# Patient Record
Sex: Female | Born: 1965 | Race: Black or African American | Hispanic: No | Marital: Single | Smoking: Never smoker
Health system: Southern US, Community
[De-identification: ages and names within clinical notes are randomized; demographics above are authoritative.]

## PROBLEM LIST (undated history)

## (undated) ENCOUNTER — Emergency Department (HOSPITAL_BASED_OUTPATIENT_CLINIC_OR_DEPARTMENT_OTHER): Admission: EM | Payer: Self-pay | Source: Home / Self Care

## (undated) DIAGNOSIS — G2581 Restless legs syndrome: Secondary | ICD-10-CM

## (undated) DIAGNOSIS — G4730 Sleep apnea, unspecified: Secondary | ICD-10-CM

## (undated) DIAGNOSIS — I10 Essential (primary) hypertension: Secondary | ICD-10-CM

## (undated) DIAGNOSIS — D573 Sickle-cell trait: Secondary | ICD-10-CM

## (undated) HISTORY — PX: LITHOTRIPSY: SUR834

## (undated) HISTORY — PX: APPENDECTOMY: SHX54

## (undated) HISTORY — PX: DILATION AND CURETTAGE OF UTERUS: SHX78

## (undated) HISTORY — PX: TONSILLECTOMY: SUR1361

## (undated) HISTORY — PX: ABDOMINAL HYSTERECTOMY: SHX81

---

## 2010-08-08 ENCOUNTER — Other Ambulatory Visit (HOSPITAL_BASED_OUTPATIENT_CLINIC_OR_DEPARTMENT_OTHER): Payer: Self-pay | Admitting: Family Medicine

## 2010-08-08 ENCOUNTER — Ambulatory Visit (HOSPITAL_BASED_OUTPATIENT_CLINIC_OR_DEPARTMENT_OTHER)
Admission: RE | Admit: 2010-08-08 | Discharge: 2010-08-08 | Disposition: A | Discharge status: Home / Self Care | Payer: BC Managed Care – PPO | Source: Ambulatory Visit | Attending: Family Medicine | Admitting: Family Medicine

## 2010-08-08 ENCOUNTER — Ambulatory Visit (INDEPENDENT_AMBULATORY_CARE_PROVIDER_SITE_OTHER)
Admission: RE | Admit: 2010-08-08 | Discharge: 2010-08-08 | Disposition: A | Discharge status: Home / Self Care | Payer: BC Managed Care – PPO | Source: Ambulatory Visit | Attending: Family Medicine | Admitting: Family Medicine

## 2010-08-08 DIAGNOSIS — R109 Unspecified abdominal pain: Secondary | ICD-10-CM | POA: Insufficient documentation

## 2010-08-08 DIAGNOSIS — R52 Pain, unspecified: Secondary | ICD-10-CM

## 2010-08-08 DIAGNOSIS — J984 Other disorders of lung: Secondary | ICD-10-CM

## 2010-08-08 DIAGNOSIS — N200 Calculus of kidney: Secondary | ICD-10-CM | POA: Insufficient documentation

## 2010-08-09 ENCOUNTER — Other Ambulatory Visit (HOSPITAL_BASED_OUTPATIENT_CLINIC_OR_DEPARTMENT_OTHER): Payer: Self-pay | Admitting: Emergency Medicine

## 2010-08-09 DIAGNOSIS — R52 Pain, unspecified: Secondary | ICD-10-CM

## 2010-09-02 ENCOUNTER — Other Ambulatory Visit (HOSPITAL_BASED_OUTPATIENT_CLINIC_OR_DEPARTMENT_OTHER): Payer: Self-pay | Admitting: Family Medicine

## 2010-09-02 DIAGNOSIS — R222 Localized swelling, mass and lump, trunk: Secondary | ICD-10-CM

## 2010-09-03 ENCOUNTER — Ambulatory Visit (HOSPITAL_BASED_OUTPATIENT_CLINIC_OR_DEPARTMENT_OTHER)
Admission: RE | Admit: 2010-09-03 | Discharge: 2010-09-03 | Disposition: A | Discharge status: Home / Self Care | Payer: BC Managed Care – PPO | Source: Ambulatory Visit | Attending: Family Medicine | Admitting: Family Medicine

## 2010-09-03 DIAGNOSIS — R222 Localized swelling, mass and lump, trunk: Secondary | ICD-10-CM

## 2010-09-03 DIAGNOSIS — J984 Other disorders of lung: Secondary | ICD-10-CM | POA: Insufficient documentation

## 2010-09-03 MED ORDER — IOHEXOL 300 MG/ML  SOLN
100.0000 mL | Freq: Once | INTRAMUSCULAR | Status: AC | PRN
  Administered 2010-09-03: 80 mL via INTRAVENOUS

## 2010-09-26 ENCOUNTER — Emergency Department (HOSPITAL_BASED_OUTPATIENT_CLINIC_OR_DEPARTMENT_OTHER)
Admission: EM | Admit: 2010-09-26 | Discharge: 2010-09-26 | Disposition: A | Discharge status: Home / Self Care | Payer: BC Managed Care – PPO | Attending: Emergency Medicine | Admitting: Emergency Medicine

## 2010-09-26 ENCOUNTER — Encounter: Payer: Self-pay | Admitting: Emergency Medicine

## 2010-09-26 DIAGNOSIS — I10 Essential (primary) hypertension: Secondary | ICD-10-CM | POA: Insufficient documentation

## 2010-09-26 DIAGNOSIS — H109 Unspecified conjunctivitis: Secondary | ICD-10-CM | POA: Insufficient documentation

## 2010-09-26 DIAGNOSIS — G4730 Sleep apnea, unspecified: Secondary | ICD-10-CM | POA: Insufficient documentation

## 2010-09-26 DIAGNOSIS — H5710 Ocular pain, unspecified eye: Secondary | ICD-10-CM | POA: Insufficient documentation

## 2010-09-26 HISTORY — DX: Essential (primary) hypertension: I10

## 2010-09-26 HISTORY — DX: Sickle-cell trait: D57.3

## 2010-09-26 HISTORY — DX: Sleep apnea, unspecified: G47.30

## 2010-09-26 HISTORY — DX: Restless legs syndrome: G25.81

## 2010-09-26 NOTE — ED Notes (Signed)
PCP Roselyn Meier [facility]

## 2010-09-26 NOTE — ED Notes (Signed)
Pt c/o bilateral eye pain; started with drainage on Thurs; was seen yesterday at [facility] for same; started on cipro gtts

## 2010-09-26 NOTE — ED Provider Notes (Signed)
History     Chief Complaint  Patient presents with  . Eye Pain   HPI Comments: Patient notes that she started having watery eye discharge on Thursday. Since then she's had gradual reddening of both of her eyes. She's had increasing yellowish white discharge. She was seen apparently yesterday in started on Cipro eyedrops every 2 hours while she's awake. Today she feels that the symptoms are not improving so she has come in for for further evaluation.  Patient is a 45 y.o. female presenting with eye pain. The history is provided by the patient.  Eye Pain This is a new problem. The current episode started more than 2 days ago. The problem occurs constantly. The problem has been gradually worsening. Pertinent negatives include no chest pain, no abdominal pain, no headaches and no shortness of breath. Exacerbated by: light. The symptoms are relieved by nothing. She has tried a cold compress (cipro gtts given to her by [facility] yesterday) for the symptoms. The treatment provided no relief.    Past Medical History  Diagnosis Date  . Hypertension   . Sickle cell trait   . Sleep apnea   . RLS (restless legs syndrome)     Past Surgical History  Procedure Date  . Appendectomy   . Lithotripsy   . Abdominal hysterectomy   . Tonsillectomy   . Dilation and curettage of uterus     No family history on file.  History  Substance Use Topics  . Smoking status: Never Smoker   . Smokeless tobacco: Not on file  . Alcohol Use: No    OB History    Grav Para Term Preterm Abortions TAB SAB Ect Mult Living                  Review of Systems  Constitutional: Negative.  Negative for fever and chills.  HENT: Negative.   Eyes: Positive for photophobia, pain, discharge and redness.  Respiratory: Negative.  Negative for cough and shortness of breath.   Cardiovascular: Negative.  Negative for chest pain.  Gastrointestinal: Negative.  Negative for nausea, vomiting, abdominal pain and diarrhea.    Genitourinary: Negative.  Negative for dysuria and vaginal discharge.  Musculoskeletal: Negative.  Negative for back pain.  Skin: Negative.  Negative for color change and rash.  Neurological: Negative.  Negative for syncope and headaches.  Hematological: Negative.  Negative for adenopathy.  Psychiatric/Behavioral: Negative.  Negative for confusion.  All other systems reviewed and are negative.    Physical Exam  BP 120/71  Pulse 96  Temp(Src) 98.5 F (36.9 C) (Oral)  Resp 18  Ht 4\' 11"  (1.499 m)  Wt 209 lb (94.802 kg)  BMI 42.21 kg/m2  SpO2 97%  Physical Exam  Constitutional: She is oriented to person, place, and time. She appears well-developed and well-nourished.  HENT:  Head: Normocephalic and atraumatic.  Eyes: EOM are normal. Pupils are equal, round, and reactive to light. Right eye exhibits discharge. Left eye exhibits no discharge. No scleral icterus.       Bilateral conjunctiva are red. No discharge currently. Pupils are equal round reactive to light. No foreign bodies noted.  Pulmonary/Chest: Effort normal.  Musculoskeletal: Normal range of motion.  Neurological: She is alert and oriented to person, place, and time.  Skin: Skin is warm and dry.  Psychiatric: She has a normal mood and affect. Her behavior is normal. Judgment and thought content normal.    ED Course  Procedures  MDM Patient is already on Cipro eyedrops.  Advised her to continue on current drops. She is to followup with her own optometrist or ophthalmologist later this week if she's not improving. She understands these instructions.      Nat Christen, MD 09/26/10 [phone]

## 2011-08-11 DIAGNOSIS — I10 Essential (primary) hypertension: Secondary | ICD-10-CM | POA: Insufficient documentation

## 2011-10-13 DIAGNOSIS — G43409 Hemiplegic migraine, not intractable, without status migrainosus: Secondary | ICD-10-CM | POA: Insufficient documentation

## 2012-04-16 DIAGNOSIS — G4700 Insomnia, unspecified: Secondary | ICD-10-CM | POA: Insufficient documentation

## 2015-08-26 ENCOUNTER — Ambulatory Visit (INDEPENDENT_AMBULATORY_CARE_PROVIDER_SITE_OTHER): Payer: BC Managed Care – PPO | Admitting: Physician Assistant

## 2015-08-26 VITALS — BP 126/80 | HR 85 | Temp 98.3°F | Resp 18 | Ht 59.0 in | Wt 209.0 lb

## 2015-08-26 DIAGNOSIS — M62838 Other muscle spasm: Secondary | ICD-10-CM

## 2015-08-26 DIAGNOSIS — D573 Sickle-cell trait: Secondary | ICD-10-CM | POA: Insufficient documentation

## 2015-08-26 MED ORDER — CYCLOBENZAPRINE HCL 5 MG PO TABS: 5.0000 mg | ORAL_TABLET | Freq: Three times a day (TID) | ORAL | Status: DC | PRN

## 2015-08-26 MED ORDER — HYDROCODONE-ACETAMINOPHEN 5-325 MG PO TABS: 1.0000 | ORAL_TABLET | Freq: Every evening | ORAL | Status: DC | PRN

## 2015-08-26 MED ORDER — MELOXICAM 15 MG PO TABS: 15.0000 mg | ORAL_TABLET | Freq: Every day | ORAL | Status: DC

## 2015-08-26 NOTE — Progress Notes (Signed)
Molly Padilla  MRN: [id number] DOB: 08/06/1965  Subjective:  Pt presents to clinic with left shoulder/neck pain that started on 6/5 when she was in the airport traveling and carried her luggage on her right shoulder through a long airport terminal.  Since then she has been having pain.  She thought that it would get better with time but it has not.  She is now having pain go down her let arm and up into her left neck.  She has no tingling or paresthesia.  She has been using icy hot and advil but not getting much relief  She had some left over hydrocodone that has helped the pain so she can sleep.  She is unable to sleep well because she cannot get the area comfortable.  She is right handed and she has had carpal tunnel release on the right and so she does not do a lot of lifting on that side.  Patient Active Problem List   Diagnosis Date Noted  . Sickle cell trait (HCC) 08/26/2015  . Cannot sleep 04/16/2012  . Headache, hemiplegic migraine 10/13/2011  . Essential (primary) hypertension 08/11/2011  . Morbid obesity (HCC) 08/11/2011  . Obstructive apnea 06/05/2009    Current Outpatient Prescriptions on File Prior to Visit  Medication Sig Dispense Refill  . triamterene-hydrochlorothiazide (DYAZIDE) 37.5-25 MG per capsule Take 1 capsule by mouth every morning.       No current facility-administered medications on file prior to visit.    Allergies  Allergen Reactions  . Zithromax [Azithromycin Dihydrate] Other (See Comments)    unknown reaction    Review of Systems  Constitutional: Negative for fever and chills.  Musculoskeletal: Positive for neck pain.   Objective:  BP 126/80 mmHg  Pulse 85  Temp(Src) 98.3 F (36.8 C) (Oral)  Resp 18  Ht  (1.499 m)  Wt 209 lb (94.802 kg)  BMI 42.19 kg/m2  SpO2 98%  Physical Exam  Constitutional: She is oriented to person, place, and time and well-developed, well-nourished, and in no distress.  HENT:  Head: Normocephalic and  atraumatic.  Right Ear: Hearing and external ear normal.  Left Ear: Hearing and external ear normal.  Eyes: Conjunctivae are normal.  Neck: Normal range of motion.  Pulmonary/Chest: Effort normal.  Musculoskeletal:       Right shoulder: Normal.       Left shoulder: She exhibits tenderness (muslcular TTP over trapezius and deltoid). She exhibits normal range of motion and no bony tenderness.       Cervical back: She exhibits tenderness.       Back:       Right upper arm: Normal.       Left upper arm: Normal.  Neurological: She is alert and oriented to person, place, and time. She has normal sensation, normal strength and normal reflexes. Gait normal.  Skin: Skin is warm and dry.  Psychiatric: Mood, memory, affect and judgment normal.  Vitals reviewed.   Assessment and Plan :  Muscle spasm - Plan: cyclobenzaprine (FLEXERIL) 5 MG tablet, meloxicam (MOBIC) 15 MG tablet, HYDROcodone-acetaminophen (NORCO/VICODIN) 5-325 MG tablet   Pt has an overuse strain that we will treat with heat and NSAIDs with muscle relaxers.  She will use the norco for at bedtime to help her sleep.  She will do some home PT and if it is not improving in the next 10 day we will consider formal PT due to the time frame that she has her symptoms.  Benny Lennert PA-C  [facility] 08/26/2015 5:51 PM

## 2015-08-26 NOTE — Patient Instructions (Addendum)
Heat to the area -   EXERCISES RANGE OF MOTION (ROM) AND STRETCHING EXERCISES - Cervical Strain and Sprain These exercises may help you when beginning to rehabilitate your injury. In order to successfully resolve your symptoms, you must improve your posture. These exercises are designed to help reduce the forward-head and rounded-shoulder posture which contributes to this condition. Your symptoms may resolve with or without further involvement from your physician, physical therapist or athletic trainer. While completing these exercises, remember:   Restoring tissue flexibility helps normal motion to return to the joints. This allows healthier, less painful movement and activity.  An effective stretch should be held for at least 20 seconds, although you may need to begin with shorter hold times for comfort.  A stretch should never be painful. You should only feel a gentle lengthening or release in the stretched tissue. STRETCH- Axial Extensors  Lie on your back on the floor. You may bend your knees for comfort. Place a rolled-up hand towel or dish towel, about 2 inches in diameter, under the part of your head that makes contact with the floor.  Gently tuck your chin, as if trying to make a "double chin," until you feel a gentle stretch at the base of your head.  Hold __________ seconds. Repeat __________ times. Complete this exercise __________ times per day.  STRETCH - Axial Extension   Stand or sit on a firm surface. Assume a good posture: chest up, shoulders drawn back, abdominal muscles slightly tense, knees unlocked (if standing) and feet hip width apart.  Slowly retract your chin so your head slides back and your chin slightly lowers. Continue to look straight ahead.  You should feel a gentle stretch in the back of your head. Be certain not to feel an aggressive stretch since this can cause headaches later.  Hold for __________ seconds. Repeat __________ times. Complete this exercise  __________ times per day. STRETCH - Cervical Side Bend   Stand or sit on a firm surface. Assume a good posture: chest up, shoulders drawn back, abdominal muscles slightly tense, knees unlocked (if standing) and feet hip width apart.  Without letting your nose or shoulders move, slowly tip your right / left ear to your shoulder until your feel a gentle stretch in the muscles on the opposite side of your neck.  Hold __________ seconds. Repeat __________ times. Complete this exercise __________ times per day. STRETCH - Cervical Rotators   Stand or sit on a firm surface. Assume a good posture: chest up, shoulders drawn back, abdominal muscles slightly tense, knees unlocked (if standing) and feet hip width apart.  Keeping your eyes level with the ground, slowly turn your head until you feel a gentle stretch along the back and opposite side of your neck.  Hold __________ seconds. Repeat __________ times. Complete this exercise __________ times per day. RANGE OF MOTION - Neck Circles   Stand or sit on a firm surface. Assume a good posture: chest up, shoulders drawn back, abdominal muscles slightly tense, knees unlocked (if standing) and feet hip width apart.  Gently roll your head down and around from the back of one shoulder to the back of the other. The motion should never be forced or painful.  Repeat the motion 10-20 times, or until you feel the neck muscles relax and loosen. Repeat __________ times. Complete the exercise __________ times per day. STRENGTHENING EXERCISES - Cervical Strain and Sprain These exercises may help you when beginning to rehabilitate your injury. They may resolve  your symptoms with or without further involvement from your physician, physical therapist, or athletic trainer. While completing these exercises, remember:   Muscles can gain both the endurance and the strength needed for everyday activities through controlled exercises.  Complete these exercises as  instructed by your physician, physical therapist, or athletic trainer. Progress the resistance and repetitions only as guided.  You may experience muscle soreness or fatigue, but the pain or discomfort you are trying to eliminate should never worsen during these exercises. If this pain does worsen, stop and make certain you are following the directions exactly. If the pain is still present after adjustments, discontinue the exercise until you can discuss the trouble with your clinician. STRENGTH - Cervical Flexors, Isometric  Face a wall, standing about 6 inches away. Place a small pillow, a ball about 6-8 inches in diameter, or a folded towel between your forehead and the wall.  Slightly tuck your chin and gently push your forehead into the soft object. Push only with mild to moderate intensity, building up tension gradually. Keep your jaw and forehead relaxed.  Hold 10 to 20 seconds. Keep your breathing relaxed.  Release the tension slowly. Relax your neck muscles completely before you start the next repetition. Repeat __________ times. Complete this exercise __________ times per day. STRENGTH- Cervical Lateral Flexors, Isometric   Stand about 6 inches away from a wall. Place a small pillow, a ball about 6-8 inches in diameter, or a folded towel between the side of your head and the wall.  Slightly tuck your chin and gently tilt your head into the soft object. Push only with mild to moderate intensity, building up tension gradually. Keep your jaw and forehead relaxed.  Hold 10 to 20 seconds. Keep your breathing relaxed.  Release the tension slowly. Relax your neck muscles completely before you start the next repetition. Repeat __________ times. Complete this exercise __________ times per day. STRENGTH - Cervical Extensors, Isometric   Stand about 6 inches away from a wall. Place a small pillow, a ball about 6-8 inches in diameter, or a folded towel between the back of your head and the  wall.  Slightly tuck your chin and gently tilt your head back into the soft object. Push only with mild to moderate intensity, building up tension gradually. Keep your jaw and forehead relaxed.  Hold 10 to 20 seconds. Keep your breathing relaxed.  Release the tension slowly. Relax your neck muscles completely before you start the next repetition. Repeat __________ times. Complete this exercise __________ times per day. POSTURE AND BODY MECHANICS CONSIDERATIONS - Cervical Strain and Sprain Keeping correct posture when sitting, standing or completing your activities will reduce the stress put on different body tissues, allowing injured tissues a chance to heal and limiting painful experiences. The following are general guidelines for improved posture. Your physician or physical therapist will provide you with any instructions specific to your needs. While reading these guidelines, remember:  The exercises prescribed by your provider will help you have the flexibility and strength to maintain correct postures.  The correct posture provides the optimal environment for your joints to work. All of your joints have less wear and tear when properly supported by a spine with good posture. This means you will experience a healthier, less painful body.  Correct posture must be practiced with all of your activities, especially prolonged sitting and standing. Correct posture is as important when doing repetitive low-stress activities (typing) as it is when doing a single heavy-load activity (  lifting). PROLONGED STANDING WHILE SLIGHTLY LEANING FORWARD When completing a task that requires you to lean forward while standing in one place for a long time, place either foot up on a stationary 2- to 4-inch high object to help maintain the best posture. When both feet are on the ground, the low back tends to lose its slight inward curve. If this curve flattens (or becomes too large), then the back and your other joints  will experience too much stress, fatigue more quickly, and can cause pain.  RESTING POSITIONS Consider which positions are most painful for you when choosing a resting position. If you have pain with flexion-based activities (sitting, bending, stooping, squatting), choose a position that allows you to rest in a less flexed posture. You would want to avoid curling into a fetal position on your side. If your pain worsens with extension-based activities (prolonged standing, working overhead), avoid resting in an extended position such as sleeping on your stomach. Most people will find more comfort when they rest with their spine in a more neutral position, neither too rounded nor too arched. Lying on a non-sagging bed on your side with a pillow between your knees, or on your back with a pillow under your knees will often provide some relief. Keep in mind, being in any one position for a prolonged period of time, no matter how correct your posture, can still lead to stiffness. WALKING Walk with an upright posture. Your ears, shoulders, and hips should all line up. OFFICE WORK When working at a desk, create an environment that supports good, upright posture. Without extra support, muscles fatigue and lead to excessive strain on joints and other tissues. CHAIR:  A chair should be able to slide under your desk when your back makes contact with the back of the chair. This allows you to work closely.  The chair's height should allow your eyes to be level with the upper part of your monitor and your hands to be slightly lower than your elbows.  Body position:  Your feet should make contact with the floor. If this is not possible, use a foot rest.  Keep your ears over your shoulders. This will reduce stress on your neck and low back.   This information is not intended to replace advice given to you by your health care provider. Make sure you discuss any questions you have with your health care provider.    Document Released: 02/21/2005 Document Revised: 03/14/2014 Document Reviewed: 06/05/2008 Elsevier Interactive Patient Education 2016 [employer].     IF you received an x-ray today, you will receive an invoice from [facility]. Please contact [facility] at [phone] with questions or concerns regarding your invoice.   IF you received labwork today, you will receive an invoice from [employer]. Please contact [facility] at [phone] with questions or concerns regarding your invoice.   Our billing staff will not be able to assist you with questions regarding bills from these companies.  You will be contacted with the lab results as soon as they are available. The fastest way to get your results is to activate your My Chart account. Instructions are located on the last page of this paperwork. If you have not heard from [place] regarding the results in 2 weeks, please contact this office.

## 2015-08-31 ENCOUNTER — Ambulatory Visit (INDEPENDENT_AMBULATORY_CARE_PROVIDER_SITE_OTHER): Payer: Worker's Compensation | Admitting: Physician Assistant

## 2015-08-31 ENCOUNTER — Ambulatory Visit: Payer: Worker's Compensation

## 2015-08-31 VITALS — BP 118/84 | HR 82 | Temp 98.0°F | Resp 18 | Ht 59.0 in | Wt 209.8 lb

## 2015-08-31 DIAGNOSIS — M542 Cervicalgia: Secondary | ICD-10-CM

## 2015-08-31 DIAGNOSIS — M62838 Other muscle spasm: Secondary | ICD-10-CM | POA: Diagnosis not present

## 2015-08-31 MED ORDER — PREDNISONE 20 MG PO TABS: 40.0000 mg | ORAL_TABLET | Freq: Every day | ORAL | Status: DC

## 2015-08-31 NOTE — Patient Instructions (Addendum)
Begin prednisone and take as prescribed. If you do not have any relief after the five days, please come back and we can reevaluate. Use ice as needed. Do not lift heavy objects overhead for the next week. If symptoms worsen, come back sooner.    Cervical Radiculopathy Cervical radiculopathy happens when a nerve in the neck (cervical nerve) is pinched or bruised. This condition can develop because of an injury or as part of the normal aging process. Pressure on the cervical nerves can cause pain or numbness that runs from the neck all the way down into the arm and fingers. Usually, this condition gets better with rest. Treatment may be needed if the condition does not improve.  CAUSES This condition may be caused by:  Injury.  Slipped (herniated) disk.  Muscle tightness in the neck because of overuse.  Arthritis.  Breakdown or degeneration in the bones and joints of the spine (spondylosis) due to aging.  Bone spurs that may develop near the cervical nerves. SYMPTOMS Symptoms of this condition include:  Pain that runs from the neck to the arm and hand. The pain can be severe or irritating. It may be worse when the neck is moved.  Numbness or weakness in the affected arm and hand. DIAGNOSIS This condition may be diagnosed based on symptoms, medical history, and a physical exam. You may also have tests, including:  X-rays.  CT scan.  MRI.  Electromyogram (EMG).  Nerve conduction tests. TREATMENT In many cases, treatment is not needed for this condition. With rest, the condition usually gets better over time. If treatment is needed, options may include:  Wearing a soft neck collar for short periods of time.  Physical therapy to strengthen your neck muscles.  Medicines, such as NSAIDs, oral corticosteroids, or spinal injections.  Surgery. This may be needed if other treatments do not help. Various types of surgery may be done depending on the cause of your problems. HOME CARE  INSTRUCTIONS Managing Pain  Take over-the-counter and prescription medicines only as told by your health care provider.  If directed, apply ice to the affected area.  Put ice in a plastic bag.  Place a towel between your skin and the bag.  Leave the ice on for 20 minutes, 2-3 times per day.  If ice does not help, you can try using heat. Take a warm shower or warm bath, or use a heat pack as told by your health care provider.  Try a gentle neck and shoulder massage to help relieve symptoms. Activity  Rest as needed. Follow instructions from your health care provider about any restrictions on activities.  Do stretching and strengthening exercises as told by your health care provider or physical therapist. General Instructions  If you were given a soft collar, wear it as told by your health care provider.  Use a flat pillow when you sleep.  Keep all follow-up visits as told by your health care provider. This is important. SEEK MEDICAL CARE IF:  Your condition does not improve with treatment. SEEK IMMEDIATE MEDICAL CARE IF:  Your pain gets much worse and cannot be controlled with medicines.  You have weakness or numbness in your hand, arm, face, or leg.  You have a high fever.  You have a stiff, rigid neck.  You lose control of your bowels or your bladder (have incontinence).  You have trouble with walking, balance, or speaking.   This information is not intended to replace advice given to you by your health  care provider. Make sure you discuss any questions you have with your health care provider.   Document Released: 11/16/2000 Document Revised: 11/12/2014 Document Reviewed: 04/17/2014 Elsevier Interactive Patient Education [employer]2016 Elsevier Inc.    IF you received an x-ray today, you will receive an invoice from [facility]. Please contact [facility] at [phone] with questions or concerns regarding your invoice.   IF you received labwork today,  you will receive an invoice from [facility]. Please contact [facility] at [phone] with questions or concerns regarding your invoice.   Our billing staff will not be able to assist you with questions regarding bills from these companies.  You will be contacted with the lab results as soon as they are available. The fastest way to get your results is to activate your My Chart account. Instructions are located on the last page of this paperwork. If you have not heard from us regarding the results in 2 weeks, please contact this office.

## 2015-08-31 NOTE — Progress Notes (Signed)
Molly Padilla 02/06/1966 50 y.o.   Chief Complaint  Patient presents with  . Shoulder Pain    left shoulder. Pain spreads from shoulder down to elbow. x5 days    Date of Injury: Initial: 08/10/15, Recurrence 08/14/15  History of Present Illness:  Presents for reevaluation of work-related complaint that occurred while traveling for work 08/10/15. Was carrying heavy luggage with left arm and noticed pain. This pain was pronounced when she lifted bag to the overhead bin on the plane. Pt took advil for pain relief before traveling back on 08/14/15. During this travel, pt experienced a similar event with pain while carrying the bag and while placing it overhead in the plane.   Pt was seen in clinic on 08/26/14 and given flexeril, mobic, and norco and has not experienced any relief. Heat, ice, and stretching exercised have also not provided any relief.  Symptoms remain constant while awake but have worsened since last visit.    Review of Systems  Musculoskeletal: Positive for back pain and neck pain.       Decrease ROM moving left arm overhead.    Neurological: Negative for tingling and weakness.       Burning sensation along neck down to left shoulder and into the elbow.      Allergies  Allergen Reactions  . Zithromax [Azithromycin Dihydrate] Other (See Comments)    unknown reaction    Current medications reviewed and updated. Past medical history, family history, social history have been reviewed and updated.   Physical Exam  Constitutional: She is oriented to person, place, and time and well-developed, well-nourished, and in no distress.  HENT:  Head: Normocephalic and atraumatic.  Eyes: Conjunctivae are normal.  Musculoskeletal:       Right shoulder: She exhibits normal range of motion, no tenderness, no spasm and normal strength.       Left shoulder: She exhibits tenderness (to palpation along anterior deltoid) and spasm ( along anterior deltoid). She exhibits normal range of  motion.       Right elbow: Normal.      Left elbow: Normal.       Cervical back: She exhibits decreased range of motion (with lateral flexion to the left and with extension of the neck), tenderness (along left paraspinal muscle) and spasm (noted along trapezius muscle just superior to spinous process of scapula).       Thoracic back: She exhibits normal range of motion and no tenderness.       Lumbar back: Normal.  Strength is 5/5 bilaterally in neck, shoulders, elbows, and wrists.   Negative Hawkins, Lift off, Apley, and Drop Arm Test  Neurological: She is alert and oriented to person, place, and time. Gait normal.  Skin: Skin is warm and dry.  Psychiatric: Affect normal.   EXAM: CERVICAL SPINE - COMPLETE 4+ VIEW  COMPARISON: None.  FINDINGS: There is straightening of normal lordosis with no traumatic malalignment. Mild degenerative changes seen most marked at C6-7 with anterior osteophytes. No fractures. Neural foramina are patent on oblique views. The lateral masses of C1 align with C2. The odontoid process is normal.  IMPRESSION: Degenerative changes.  Assessment and Plan: 1. Neck pain - DG Cervical Spine Complete shows degenerative changes  -Prescribed predniSONE (DELTASONE) 20 MG tablet; Take 2 tablets (40 mg total) by mouth daily with breakfast.  Dispense: 10 tablet; Refill: 0 for inflammation; last blood sugar reading in 03/2015 was 95. -Return to clinic after travel for reevaluation and possible referral to ortho for  further work up.   2. Muscle spasm -Continue flexeril as needed for muscle spasm.   Benjiman CoreBrittany Tamas Suen, PA-C  [facility] 09/04/2015 8:21 AM

## 2015-09-16 ENCOUNTER — Ambulatory Visit (INDEPENDENT_AMBULATORY_CARE_PROVIDER_SITE_OTHER): Payer: BC Managed Care – PPO | Admitting: Urgent Care

## 2015-09-16 VITALS — BP 118/70 | HR 88 | Temp 97.8°F | Resp 16 | Ht 59.5 in | Wt 210.0 lb

## 2015-09-16 DIAGNOSIS — M6248 Contracture of muscle, other site: Secondary | ICD-10-CM

## 2015-09-16 DIAGNOSIS — M62838 Other muscle spasm: Secondary | ICD-10-CM

## 2015-09-16 DIAGNOSIS — M542 Cervicalgia: Secondary | ICD-10-CM | POA: Diagnosis not present

## 2015-09-16 DIAGNOSIS — M5030 Other cervical disc degeneration, unspecified cervical region: Secondary | ICD-10-CM

## 2015-09-16 MED ORDER — NAPROXEN SODIUM 550 MG PO TABS: 550.0000 mg | ORAL_TABLET | Freq: Two times a day (BID) | ORAL | Status: AC

## 2015-09-16 MED ORDER — CYCLOBENZAPRINE HCL 5 MG PO TABS: 5.0000 mg | ORAL_TABLET | Freq: Three times a day (TID) | ORAL | Status: AC | PRN

## 2015-09-16 NOTE — Patient Instructions (Addendum)
Tylenol You may take  every 6 hours or 1,000mg  every 8 hours for pain and inflammation associated with your arthritis. You can alternate between this and Anaprox.   Degenerative Disk Disease Degenerative disk disease is a condition caused by the changes that occur in spinal disks as you grow older. Spinal disks are soft and compressible disks located between the bones of your spine (vertebrae). These disks act like shock absorbers. Degenerative disk disease can affect the whole spine. However, the neck and lower back are most commonly affected. Many changes can occur in the spinal disks with aging, such as:  The spinal disks may dry and shrink.  Small tears may occur in the tough, outer covering of the disk (annulus).  The disk space may become smaller due to loss of water.  Abnormal growths in the bone (spurs) may occur. This can put pressure on the nerve roots exiting the spinal canal, causing pain.  The spinal canal may become narrowed. RISK FACTORS   Being overweight.  Having a family history of degenerative disk disease.  Smoking.  There is increased risk if you are doing heavy lifting or have a sudden injury. SIGNS AND SYMPTOMS  Symptoms vary from person to person and may include:  Pain that varies in intensity. Some people have no pain, while others have severe pain. The location of the pain depends on the part of your backbone that is affected.  You will have neck or arm pain if a disk in the neck area is affected.  You will have pain in your back, buttocks, or legs if a disk in the lower back is affected.  Pain that becomes worse while bending, reaching up, or with twisting movements.  Pain that may start gradually and then get worse as time passes. It may also start after a major or minor injury.  Numbness or tingling in the arms or legs. DIAGNOSIS  Your health care provider will ask you about your symptoms and about activities or habits that may cause the pain.  He or she may also ask about any injuries, diseases, or treatments you have had. Your health care provider will examine you to check for the range of movement that is possible in the affected area, to check for strength in your extremities, and to check for sensation in the areas of the arms and legs supplied by different nerve roots. You may also have:   An X-ray of the spine.  Other imaging tests, such as MRI. TREATMENT  Your health care provider will advise you on the best plan for treatment. Treatment may include:  Medicines.  Rehabilitation exercises. HOME CARE INSTRUCTIONS   Follow proper lifting and walking techniques as advised by your health care provider.  Maintain good posture.  Exercise regularly as advised by your health care provider.  Perform relaxation exercises.  Change your sitting, standing, and sleeping habits as advised by your health care provider.  Change positions frequently.  Lose weight or maintain a healthy weight as advised by your health care provider.  Do not use any tobacco products, including cigarettes, chewing tobacco, or electronic cigarettes. If you need help quitting, ask your health care provider.  Wear supportive footwear.  Take medicines only as directed by your health care provider. SEEK MEDICAL CARE IF:   Your pain does not go away within 1-4 weeks.  You have significant appetite or weight loss. SEEK IMMEDIATE MEDICAL CARE IF:   Your pain is severe.  You notice weakness in your  arms, hands, or legs.  You begin to lose control of your bladder or bowel movements.  You have fevers or night sweats. MAKE SURE YOU:   Understand these instructions.  Will watch your condition.  Will get help right away if you are not doing well or get worse.   This information is not intended to replace advice given to you by your health care provider. Make sure you discuss any questions you have with your health care provider.   Document  Released: 12/19/2006 Document Revised: 03/14/2014 Document Reviewed: 06/25/2013 Elsevier Interactive Patient Education 2016 [employer]     IF you received an x-ray today, you will receive an invoice from [facility]. Please contact [facility] at [phone] with questions or concerns regarding your invoice.   IF you received labwork today, you will receive an invoice from [facility]. Please contact [facility] at [phone] with questions or concerns regarding your invoice.   Our billing staff will not be able to assist you with questions regarding bills from these companies.  You will be contacted with the lab results as soon as they are available. The fastest way to get your results is to activate your My Chart account. Instructions are located on the last page of this paperwork. If you have not heard from us regarding the results in 2 weeks, please contact this office.

## 2015-09-16 NOTE — Progress Notes (Signed)
    MRN: [id number] DOB: 12/31/1965  Subjective:   Molly Padilla is a 50 y.o. female presenting for chief complaint of muscle spasms  Patient initially presented for same on 08/26/2015, x-rays demonstrated degenerative changes, provided with short steroid course, flexeril. Today, she reports that she used meloxicam then prednisone and Flexeril with some improvement. However, patient still has pain worst at the end of her work day worst over left side. Has difficulty with ROM, pain is also radiating to her trapezius, left shoulder and arm up to bicep. Denies fever, trauma, numbness or tingling, swelling of the neck, weakness.  Aram BeechamCynthia has a current medication list which includes the following prescription(s): cyclobenzaprine, fluticasone, and triamterene-hydrochlorothiazide. Also is allergic to zithromax.  Aram BeechamCynthia  has a past medical history of Hypertension; Sickle cell trait (HCC); Sleep apnea; and RLS (restless legs syndrome). Also  has past surgical history that includes Appendectomy; Lithotripsy; Abdominal hysterectomy; Tonsillectomy; and Dilation and curettage of uterus.  Objective:   Vitals: BP 118/70 mmHg  Pulse 88  Temp(Src) 97.8 F (36.6 C) (Oral)  Resp 16  Ht 4' 11.5" (1.511 m)  Wt 210 lb (95.255 kg)  BMI 41.72 kg/m2  SpO2 97%  Physical Exam  Constitutional: She is oriented to person, place, and time. She appears well-developed and well-nourished.  HENT:  Mouth/Throat: Oropharynx is clear and moist.  Eyes: EOM are normal. Pupils are equal, round, and reactive to light.  Neck: Normal range of motion. Neck supple.  Musculoskeletal:       Cervical back: She exhibits decreased range of motion (lateral flexion), tenderness (over areas depicted) and spasm (over trapezius). She exhibits no bony tenderness, no swelling, no edema, no deformity and no laceration.       Back:  Neurological: She is alert and oriented to person, place, and time. She has normal reflexes.  Skin: Skin  is warm and dry.    Dg Cervical Spine Complete  08/31/2015  CLINICAL DATA:  Acute neck pain. EXAM: CERVICAL SPINE - COMPLETE 4+ VIEW COMPARISON:  None. FINDINGS: There is straightening of normal lordosis with no traumatic malalignment. Mild degenerative changes seen most marked at C6-7 with anterior osteophytes. No fractures. Neural foramina are patent on oblique views. The lateral masses of C1 align with C2. The odontoid process is normal. IMPRESSION: Degenerative changes. Electronically Signed   By: Gerome Samavid  Williams III M.D   On: 08/31/2015 19:12    Assessment and Plan :   1. Degenerative disc disease, cervical 2. Neck pain 3. Muscle spasms of neck - Referral to PT pending, counseled patient on diagnosis of DDD. Use APAP, Anaprox as needed.  Wallis BambergMario Jabier Deese, PA-C [facility] [phone] 09/16/2015 3:27 PM

## 2018-01-19 IMAGING — DX DG CERVICAL SPINE COMPLETE 4+V
6 series · 6 of 6 positions shown · non-contrast
Comparison: None.

CLINICAL DATA: Acute neck pain.

EXAM:
CERVICAL SPINE - COMPLETE 4+ VIEW

[c-spine lat]
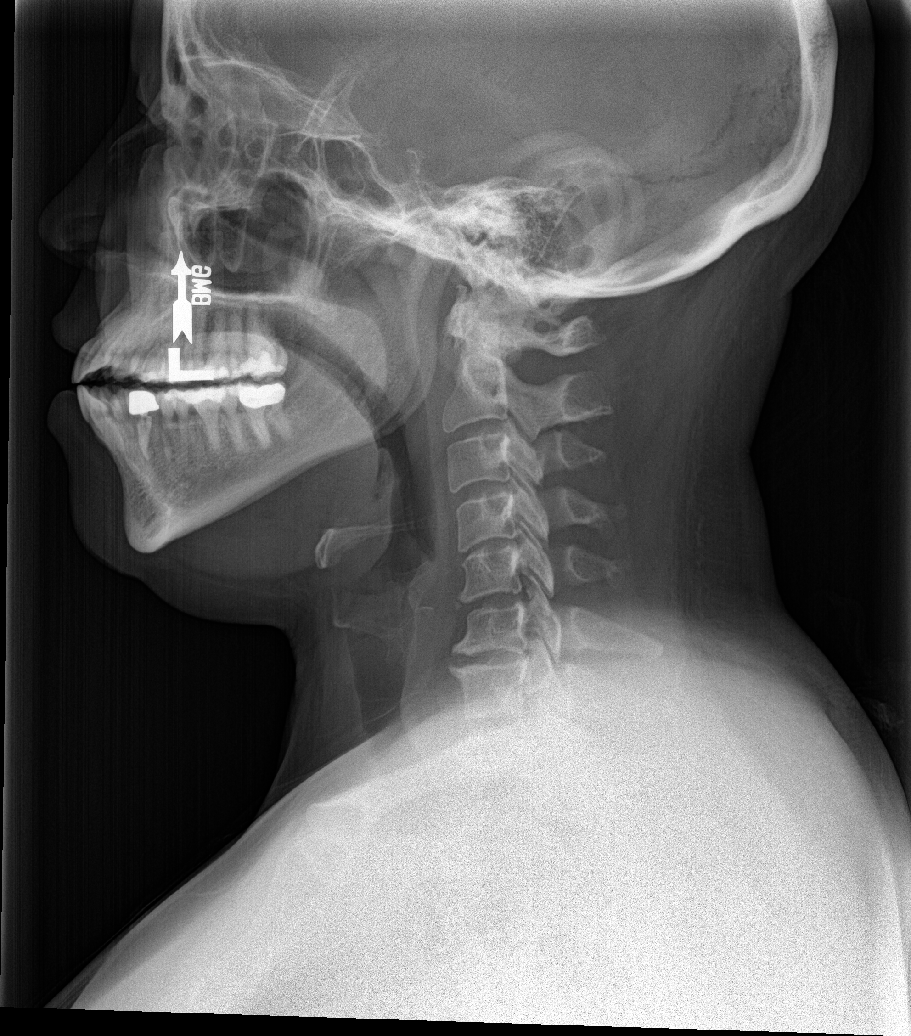

[c-spine obl (1 of 2)]
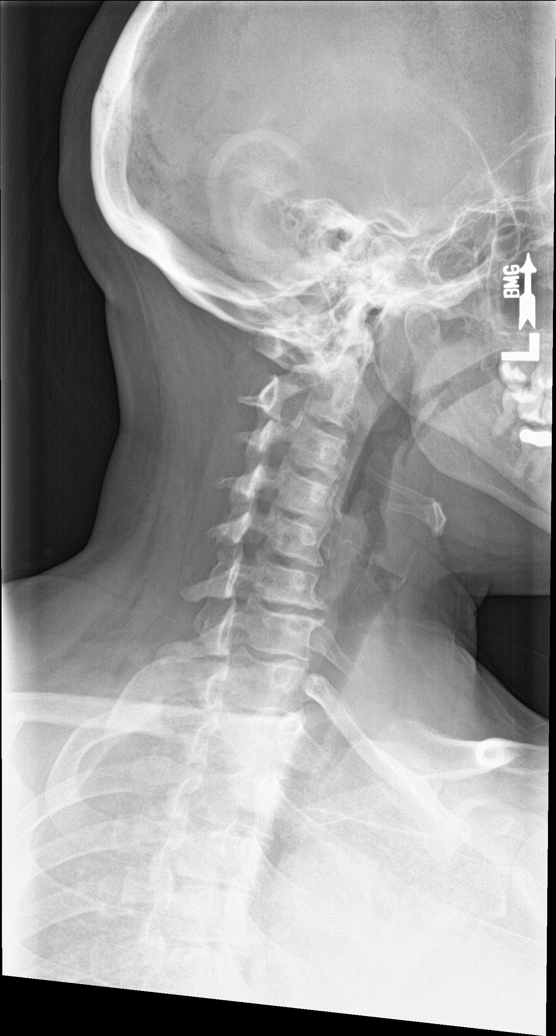

[c-spine obl (2 of 2)]
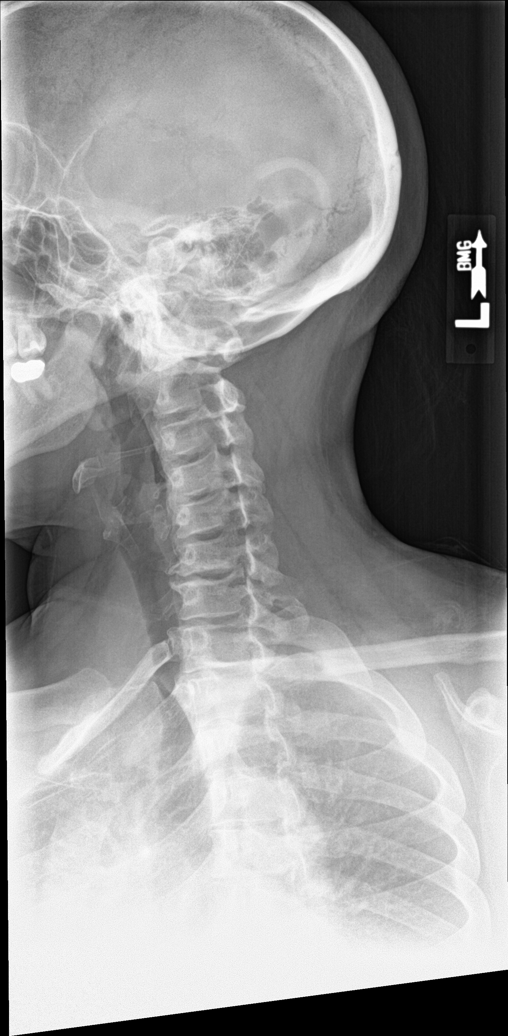

[c-spine ap]
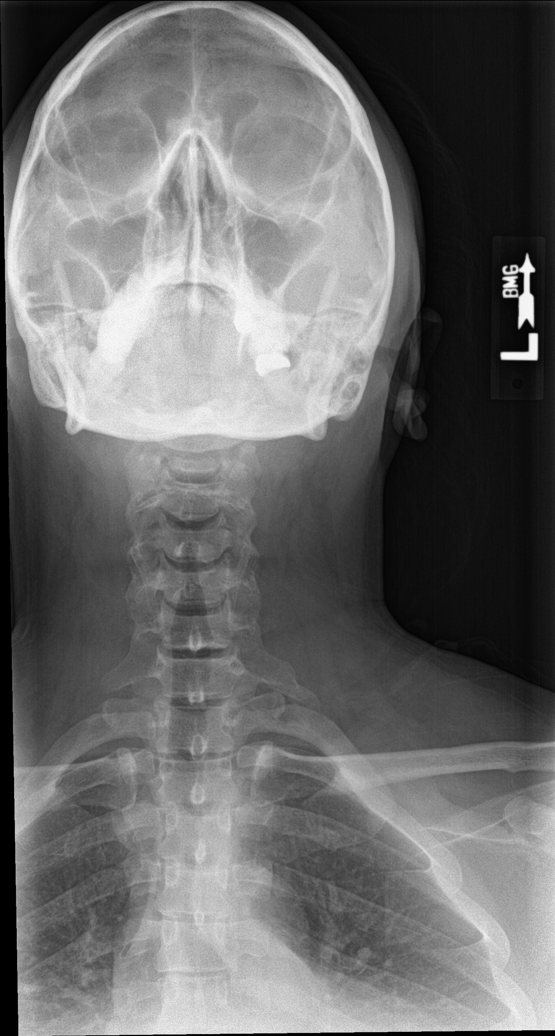

[c-spine open mouth]
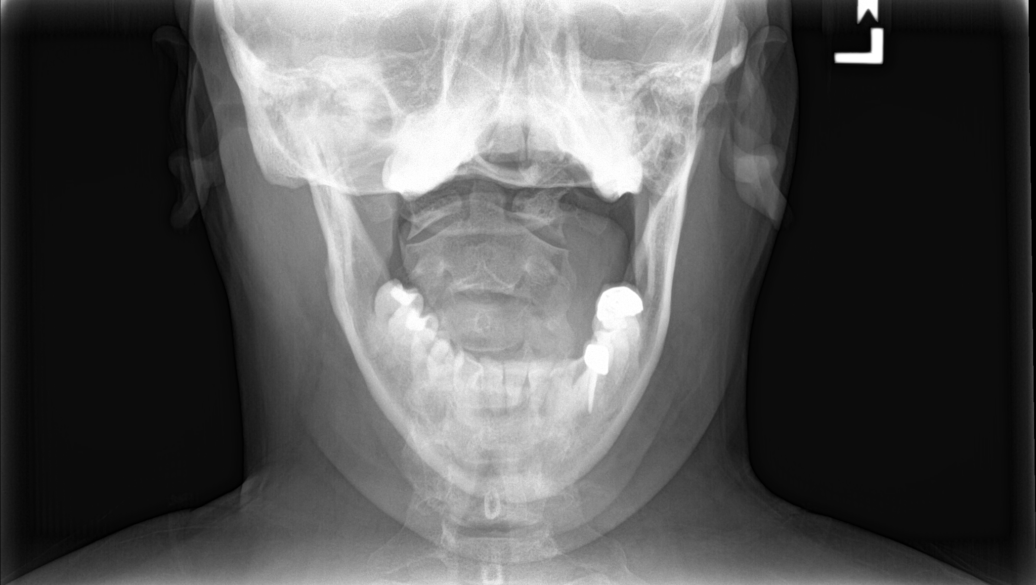

[c-spine swimmers]
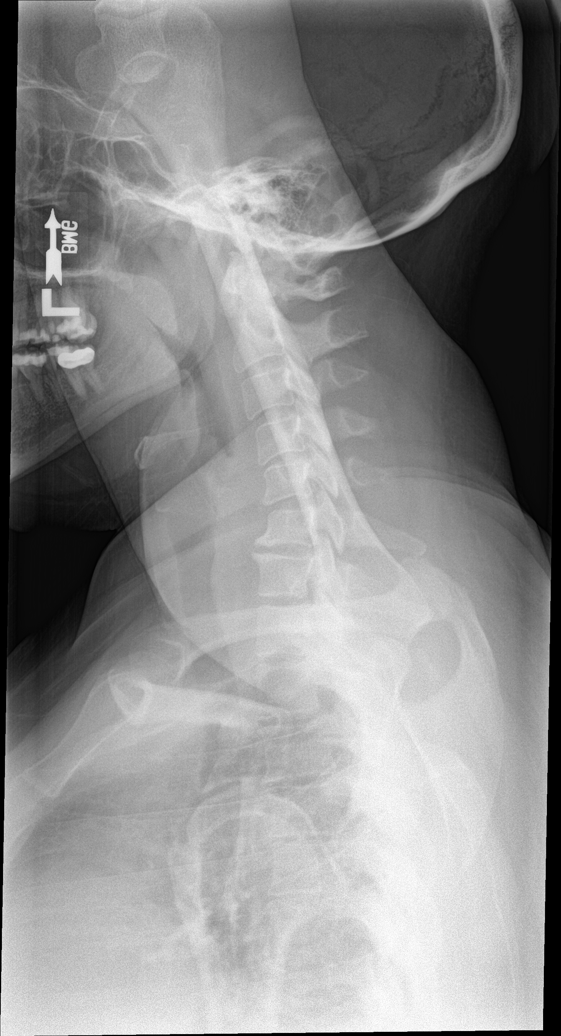

[6 of 6 positions shown; findings below may reference images not displayed]

FINDINGS: There is straightening of normal lordosis with no traumatic
malalignment. Mild degenerative changes seen most marked at C6-7
with anterior osteophytes. No fractures. Neural foramina are patent
on oblique views. The lateral masses of C1 align with C2. The
odontoid process is normal.
IMPRESSION: Degenerative changes.
